# Patient Record
Sex: Female | Born: 1980 | Hispanic: Yes | Marital: Married | Smoking: Never smoker
Health system: Southern US, Community
[De-identification: ages and names within clinical notes are randomized; demographics above are authoritative.]

---

## 2007-11-29 ENCOUNTER — Inpatient Hospital Stay (HOSPITAL_COMMUNITY): Admission: AD | Admit: 2007-11-29 | Discharge: 2007-11-29 | Payer: Self-pay | Admitting: Obstetrics and Gynecology

## 2007-12-06 ENCOUNTER — Inpatient Hospital Stay (HOSPITAL_COMMUNITY): Admission: AD | Admit: 2007-12-06 | Discharge: 2007-12-06 | Payer: Self-pay | Admitting: Obstetrics and Gynecology

## 2007-12-06 ENCOUNTER — Inpatient Hospital Stay (HOSPITAL_COMMUNITY): Admission: AD | Admit: 2007-12-06 | Discharge: 2007-12-08 | Payer: Self-pay | Admitting: Obstetrics and Gynecology

## 2009-02-01 ENCOUNTER — Inpatient Hospital Stay (HOSPITAL_COMMUNITY): Admission: AD | Admit: 2009-02-01 | Discharge: 2009-02-03 | Payer: Self-pay | Admitting: Obstetrics and Gynecology

## 2010-07-06 LAB — RPR: RPR Ser Ql: NONREACTIVE

## 2010-07-06 LAB — CBC
RBC: 3.61 MIL/uL — ABNORMAL LOW (ref 3.87–5.11)
WBC: 11.2 10*3/uL — ABNORMAL HIGH (ref 4.0–10.5)

## 2010-08-16 NOTE — Discharge Summary (Signed)
NAMESHANEICE, BARSANTI              ACCOUNT NO.:  [id number]   MEDICAL RECORD NO.:  [id number]          PATIENT TYPE:  INP   LOCATION:  9126                          FACILITY:  [facility]   PHYSICIAN:  Malachi Pro. Ambrose Mantle, M.D. DATE OF BIRTH:  12/03/1980   DATE OF ADMISSION:  12/06/2007  DATE OF DISCHARGE:  12/08/2007                               DISCHARGE SUMMARY   A 30 year old Hispanic female para 0-0-1-0, gravida 2, EDC December 26, 2007, admitted in the second stage of labor.  Blood group and type O  positive, negative antibody, sickle cell negative, RPR nonreactive,  rubella immune, hepatitis B surface antigen negative, HIV negative, GC  and chlamydia negative, 1-hour Glucola 63, quad screen negative, and  group B strep negative.  Vaginal ultrasound on June 12, 2007, crown-  rump length 5.2 cm 11 weeks 6 days, [facility] December 26, 2007.  Ultrasound  on Aug 12, 2007 average gestational age [redacted] weeks 3 days, [facility] December 27, 2007.  On November 29, 2007, fundal height was 33 cm.  Ultrasound  suggested normal biophysical profile and ultrasound on December 02, 2007,  estimated fetal weight of approximately 50%.  On the day of admission  during a nonstress test in our office, the patient had a 5-minute  deceleration.  She was sent to MAU.  Initially, the fetal heart rate was  165, but subsequently, became completely normal with reactive nonstress  test and no decelerations.  Biophysical profile was 6/8.  There was no  breathing seen.  The patient was contracting pain fully, but according  to the maternity admission nurse, the cervix remained undilated, so she  was discharged.  The patient returned in approximately 1 to 2 hours  fully dilated.   PAST MEDICAL HISTORY:  Revealed no known allergies.  No operations.   ILLNESSES:  Urinary tract infections.   FAMILY HISTORY:  Mother with high blood pressure and rheumatoid  arthritis and sister with rheumatoid arthritis.   OBSTETRICAL HISTORY:  In  1999, the patient had a termination of  pregnancy.   PHYSICAL EXAMINATION:  VITAL SIGNS:  On admission, her vital signs were  normal.  HEART:  Normal.  LUNGS:  Normal.  ABDOMEN:  Soft.  Fundal height 35 cm.  Fetal heart tones normal.  Cervix  was 10 cm and vertex at a +2 station.  The patient pushed well and  delivered spontaneously OA over an intact perineum by Dr. Ambrose Mantle a  living female infant 6 pounds 2 ounces, Apgars of 9 at 1 and 9 at 5  minutes.  Placenta was intact.  Uterus was normal.  Small bilateral  labial lacerations were hemostatic and not repaired.  The cord was  approximately 14 inches long.  Blood loss about 400 mL.  Postpartum, the  patient did well and was discharged on the second postpartum day.  Initial hemoglobin was 11.7, hematocrit 35.2, white count 10,300, and  platelet count 267,000.  RPR was nonreactive.  Followup hemoglobin 11.1,  hematocrit 32.8, and white count 18,000.   FINAL DIAGNOSIS:  Intrauterine pregnancy at 37 weeks, delivered  occipitoanterior .  OPERATION:  Spontaneous delivery OA.   FINAL CONDITION:  Improved.   INSTRUCTIONS:  Include our regular discharge instruction booklet.  The  patient is given a prescription for Motrin 600 mg 30 tablets 1 every 6  hours as needed for pain and is advised to return to the office in 6  weeks for followup examination.      Malachi Pro. Ambrose Mantle, M.D.  Electronically Signed     TFH/MEDQ  D:  12/08/2007  T:  12/08/2007  Job:  [id number]

## 2011-01-04 LAB — CBC
HCT: 35.2 — ABNORMAL LOW
Hemoglobin: 11.1 — ABNORMAL LOW
Hemoglobin: 11.7 — ABNORMAL LOW
MCHC: 33.9
MCV: 89.5
Platelets: 267
RBC: 3.67 — ABNORMAL LOW
RBC: 3.9
WBC: 10.3

## 2013-03-05 LAB — OB RESULTS CONSOLE GC/CHLAMYDIA
Chlamydia: NEGATIVE
GC PROBE AMP, GENITAL: NEGATIVE

## 2013-03-05 LAB — OB RESULTS CONSOLE RPR: RPR: NONREACTIVE

## 2013-03-05 LAB — OB RESULTS CONSOLE RUBELLA ANTIBODY, IGM: Rubella: IMMUNE

## 2013-03-05 LAB — OB RESULTS CONSOLE HIV ANTIBODY (ROUTINE TESTING): HIV: NONREACTIVE

## 2013-03-05 LAB — OB RESULTS CONSOLE HEPATITIS B SURFACE ANTIGEN: Hepatitis B Surface Ag: NEGATIVE

## 2013-03-05 LAB — OB RESULTS CONSOLE ANTIBODY SCREEN: Antibody Screen: NEGATIVE

## 2013-04-30 LAB — OB RESULTS CONSOLE ABO/RH: RH TYPE: POSITIVE

## 2013-09-06 ENCOUNTER — Inpatient Hospital Stay (HOSPITAL_COMMUNITY)
Admission: AD | Admit: 2013-09-06 | Discharge: 2013-09-08 | DRG: 775 | Disposition: A | Discharge status: Home / Self Care | Payer: Medicaid Other | Source: Ambulatory Visit | Attending: Obstetrics and Gynecology | Admitting: Obstetrics and Gynecology

## 2013-09-06 ENCOUNTER — Encounter (HOSPITAL_COMMUNITY): Payer: Self-pay | Admitting: *Deleted

## 2013-09-06 DIAGNOSIS — Z833 Family history of diabetes mellitus: Secondary | ICD-10-CM

## 2013-09-06 DIAGNOSIS — IMO0001 Reserved for inherently not codable concepts without codable children: Secondary | ICD-10-CM

## 2013-09-06 LAB — CBC
HCT: 35.7 % — ABNORMAL LOW (ref 36.0–46.0)
Hemoglobin: 12 g/dL (ref 12.0–15.0)
MCH: 29 pg (ref 26.0–34.0)
MCHC: 33.6 g/dL (ref 30.0–36.0)
MCV: 86.2 fL (ref 78.0–100.0)
PLATELETS: 242 10*3/uL (ref 150–400)
RBC: 4.14 MIL/uL (ref 3.87–5.11)
RDW: 13.2 % (ref 11.5–15.5)
WBC: 10.1 10*3/uL (ref 4.0–10.5)

## 2013-09-06 LAB — TYPE AND SCREEN
ABO/RH(D): O POS
ANTIBODY SCREEN: NEGATIVE

## 2013-09-06 MED ORDER — OXYTOCIN BOLUS FROM INFUSION: 500.0000 mL | INTRAVENOUS | Status: DC

## 2013-09-06 MED ORDER — CITRIC ACID-SODIUM CITRATE 334-500 MG/5ML PO SOLN: 30.0000 mL | ORAL | Status: DC | PRN

## 2013-09-06 MED ORDER — IBUPROFEN 600 MG PO TABS: 600.0000 mg | ORAL_TABLET | Freq: Four times a day (QID) | ORAL | Status: DC | PRN

## 2013-09-06 MED ORDER — LIDOCAINE HCL (PF) 1 % IJ SOLN
30.0000 mL | INTRAMUSCULAR | Status: DC | PRN
  Filled 2013-09-06: qty 30

## 2013-09-06 MED ORDER — OXYTOCIN 40 UNITS IN LACTATED RINGERS INFUSION - SIMPLE MED
62.5000 mL/h | INTRAVENOUS | Status: DC
  Filled 2013-09-06: qty 1000

## 2013-09-06 MED ORDER — ONDANSETRON HCL 4 MG/2ML IJ SOLN: 4.0000 mg | Freq: Four times a day (QID) | INTRAMUSCULAR | Status: DC | PRN

## 2013-09-06 MED ORDER — LACTATED RINGERS IV SOLN: 500.0000 mL | INTRAVENOUS | Status: DC | PRN

## 2013-09-06 MED ORDER — ACETAMINOPHEN 325 MG PO TABS: 650.0000 mg | ORAL_TABLET | ORAL | Status: DC | PRN

## 2013-09-06 MED ORDER — OXYCODONE-ACETAMINOPHEN 5-325 MG PO TABS: 1.0000 | ORAL_TABLET | ORAL | Status: DC | PRN

## 2013-09-06 MED ORDER — FLEET ENEMA 7-19 GM/118ML RE ENEM: 1.0000 | ENEMA | RECTAL | Status: DC | PRN

## 2013-09-06 MED ORDER — LACTATED RINGERS IV SOLN
INTRAVENOUS | Status: DC
  Administered 2013-09-06: 21:00:00 via INTRAVENOUS

## 2013-09-06 NOTE — Progress Notes (Signed)
Patient ID: [name], female   DOB: 1981-01-27, 33 y.o.   MRN: [id number] Delivery note:  The pt progressed rapidly to full dilatation and pushed well to deliver a living female infant spontaneously ROA over an intact perineum. The placenta delivered intact and the uterus was normal. There were no lacerations. The pt's IV became disconnected while she was pushing and required a restart. Apgars were 9 and 9 at 1 and 5 minutes.

## 2013-09-06 NOTE — Progress Notes (Signed)
Patient ID: [name], female   DOB: 1980/11/26, 33 y.o.   MRN: [id number] Contractions are q 2-3 minutes and the cervix is 4-5 cm 100% effaced and the vertex is at - 1 station. AROM produced clear fluid.

## 2013-09-06 NOTE — MAU Note (Signed)
PT SAYS SHE HAS UC-  HURTING BAD  AT   630PM-.   VE IN OFFICE ON Friday- 3 CM.    DENIES HSV AND MRSA.  GBS- NEG

## 2013-09-06 NOTE — H&P (Signed)
NAMEWildred, [name]            ACCOUNT NO.:  [id number]  MEDICAL RECORD NO.:  [id number]  LOCATION:  9163                          FACILITY:  [facility]  PHYSICIAN:  Malachi Pro. Ambrose Mantle, M.D. DATE OF BIRTH:  03-18-1981  DATE OF ADMISSION:  09/06/2013 DATE OF DISCHARGE:                             HISTORY & PHYSICAL   PRESENT ILLNESS:  This is a 33 year old female, para 1-1-1-2, gravida 4 with last period December 05, 2013.  [facility] September 09, 2013, who was admitted in active labor.  Blood group and type O positive, negative antibody. RPR negative.  Urine culture negative.  Hepatitis B surface antigen negative, HIV negative, GC and Chlamydia negative, rubella immune, first trimester screen negative.  AFP negative.  One-hour Glucola 98.  Repeat HIV and RPR negative, group B strep negative.  The patient began her prenatal course at 13 weeks and 1 day.  Ultrasound on that day suggested a due date of September 09, 2013.  Because of her history of preterm birth, she elected to start Delalutin at 16 weeks and she took the Delalutin weekly until 36 weeks.  She began her prenatal course at 146 pounds. Initially her weight gain was not up to standards and I advised her to gain weight.  She seemed to have no problems during the pregnancy.  Her final weight at the last prenatal visit was 21 pounds greater than her initial weight.  At her last prenatal visit, she was 3 cm.  Cervix was 40% effaced, vertex at a -3 station.  She states that she began contracting tonight more forcefully, came to the hospital.  The admitting nurse and MAU thought she was 5 cm, 90%.  She was admitted.  PAST MEDICAL HISTORY:  Reveals she had chickenpox as a child, cystic fibrosis carrier screen was negative in 2009.  PAST SURGICAL HISTORY:  None.  She had a colposcopy in 2007.  ALLERGIES:  She has no known drug allergies, no latex allergy, and no food allergy.  SOCIAL HISTORY:  She never smoked.  Does not drink.  Denies  illicit drugs.  She is married, has 2 children at home, one born in 2009 and one in 2010.  FAMILY HISTORY:  Mother with high blood pressure, rheumatoid arthritis. Sister with rheumatoid arthritis and high blood pressure.  Two sisters have sickle cell trait.  Paternal grandfather has diabetes.  PHYSICAL EXAMINATION:  GENERAL:  On admission, well-developed, well- nourished female in no distress except having contractions. VITAL SIGNS:  Temperature 98.8, pulse 91, respirations 18, blood pressure 123/78. HEART:  Normal size and sounds.  No murmurs. LUNGS:  Clear to auscultation.  Fundal height at her last prenatal visit was 37 cm.  Fetal heart tones normal.  Cervix is not reexamined at this time, was thought to be 5 cm in the MAU.  ADMITTING IMPRESSION:  Intrauterine pregnancy at 39+ weeks, active labor.  The patient is admitted.  Her progress in labor will be observed.     Malachi Pro. Ambrose Mantle, M.D.     TFH/MEDQ  D:  09/06/2013  T:  09/06/2013  Job:  [id number]

## 2013-09-07 ENCOUNTER — Encounter (HOSPITAL_COMMUNITY): Payer: Self-pay | Admitting: *Deleted

## 2013-09-07 LAB — CBC
HCT: 33.8 % — ABNORMAL LOW (ref 36.0–46.0)
HEMOGLOBIN: 11.6 g/dL — AB (ref 12.0–15.0)
MCH: 29.4 pg (ref 26.0–34.0)
MCHC: 34.3 g/dL (ref 30.0–36.0)
MCV: 85.6 fL (ref 78.0–100.0)
Platelets: 220 10*3/uL (ref 150–400)
RBC: 3.95 MIL/uL (ref 3.87–5.11)
RDW: 13.2 % (ref 11.5–15.5)
WBC: 14.7 10*3/uL — ABNORMAL HIGH (ref 4.0–10.5)

## 2013-09-07 LAB — RPR

## 2013-09-07 LAB — ABO/RH: ABO/RH(D): O POS

## 2013-09-07 MED ORDER — LANOLIN HYDROUS EX OINT: TOPICAL_OINTMENT | CUTANEOUS | Status: DC | PRN

## 2013-09-07 MED ORDER — PRENATAL MULTIVITAMIN CH
1.0000 | ORAL_TABLET | Freq: Every day | ORAL | Status: DC
  Filled 2013-09-07: qty 1
  Filled 2013-09-07: qty 2

## 2013-09-07 MED ORDER — SIMETHICONE 80 MG PO CHEW: 80.0000 mg | CHEWABLE_TABLET | ORAL | Status: DC | PRN

## 2013-09-07 MED ORDER — SENNOSIDES-DOCUSATE SODIUM 8.6-50 MG PO TABS
2.0000 | ORAL_TABLET | ORAL | Status: DC
  Administered 2013-09-08: 2 via ORAL
  Filled 2013-09-07: qty 2

## 2013-09-07 MED ORDER — ONDANSETRON HCL 4 MG PO TABS: 4.0000 mg | ORAL_TABLET | ORAL | Status: DC | PRN

## 2013-09-07 MED ORDER — OXYTOCIN 40 UNITS IN LACTATED RINGERS INFUSION - SIMPLE MED: 62.5000 mL/h | INTRAVENOUS | Status: AC | PRN

## 2013-09-07 MED ORDER — IBUPROFEN 600 MG PO TABS
600.0000 mg | ORAL_TABLET | Freq: Four times a day (QID) | ORAL | Status: DC
  Administered 2013-09-07 – 2013-09-08 (×7): 600 mg via ORAL
  Filled 2013-09-07 (×7): qty 1

## 2013-09-07 MED ORDER — ZOLPIDEM TARTRATE 5 MG PO TABS: 5.0000 mg | ORAL_TABLET | Freq: Every evening | ORAL | Status: DC | PRN

## 2013-09-07 MED ORDER — DIPHENHYDRAMINE HCL 25 MG PO CAPS: 25.0000 mg | ORAL_CAPSULE | Freq: Four times a day (QID) | ORAL | Status: DC | PRN

## 2013-09-07 MED ORDER — WITCH HAZEL-GLYCERIN EX PADS: 1.0000 "application " | MEDICATED_PAD | CUTANEOUS | Status: DC | PRN

## 2013-09-07 MED ORDER — CALCIUM CARBONATE ANTACID 500 MG PO CHEW
1.0000 | CHEWABLE_TABLET | Freq: Two times a day (BID) | ORAL | Status: DC
  Filled 2013-09-07: qty 1

## 2013-09-07 MED ORDER — BENZOCAINE-MENTHOL 20-0.5 % EX AERO
1.0000 "application " | INHALATION_SPRAY | CUTANEOUS | Status: DC | PRN
  Administered 2013-09-08: 1 via TOPICAL
  Filled 2013-09-07: qty 56

## 2013-09-07 MED ORDER — MEASLES, MUMPS & RUBELLA VAC ~~LOC~~ INJ
0.5000 mL | INJECTION | Freq: Once | SUBCUTANEOUS | Status: DC
  Filled 2013-09-07: qty 0.5

## 2013-09-07 MED ORDER — LACTATED RINGERS IV SOLN
INTRAVENOUS | Status: AC
  Administered 2013-09-07: 02:00:00 via INTRAVENOUS

## 2013-09-07 MED ORDER — TETANUS-DIPHTH-ACELL PERTUSSIS 5-2.5-18.5 LF-MCG/0.5 IM SUSP: 0.5000 mL | Freq: Once | INTRAMUSCULAR | Status: DC

## 2013-09-07 MED ORDER — ONDANSETRON HCL 4 MG/2ML IJ SOLN: 4.0000 mg | INTRAMUSCULAR | Status: DC | PRN

## 2013-09-07 MED ORDER — OXYCODONE-ACETAMINOPHEN 5-325 MG PO TABS
1.0000 | ORAL_TABLET | ORAL | Status: DC | PRN
  Administered 2013-09-07 – 2013-09-08 (×3): 1 via ORAL
  Filled 2013-09-07 (×4): qty 1

## 2013-09-07 MED ORDER — PRENATAL MULTIVITAMIN CH
1.0000 | ORAL_TABLET | Freq: Every day | ORAL | Status: DC
  Administered 2013-09-07 – 2013-09-08 (×2): 1 via ORAL

## 2013-09-07 MED ORDER — DIBUCAINE 1 % RE OINT: 1.0000 "application " | TOPICAL_OINTMENT | RECTAL | Status: DC | PRN

## 2013-09-07 NOTE — Lactation Note (Signed)
This note was copied from the chart of [name]. Lactation Consultation Note [facility] LC resources given and discussed.  Encouraged to feed with early cues on demand including STS.  Early newborn behavior discussed including cluster feeding and having 8-12 feeding a day.   Hand expression demonstrated with colostrum visible.  Mom to call for assist as needed.    Patient Name: Carol Wakely Gehrke OFPUL'G Date: 09/07/2013 Reason for consult: Initial assessment   Maternal Data Has patient been taught Hand Expression?: Yes Does the patient have breastfeeding experience prior to this delivery?: No  Feeding    LATCH Score/Interventions                      Lactation Tools Discussed/Used     Consult Status Consult Status: Follow-up Date: 09/08/13 Follow-up type: In-patient    Arvella Merles Reginia Battie 09/07/2013, 9:50 PM

## 2013-09-07 NOTE — Progress Notes (Signed)
Patient ID: [name], female   DOB: 1980-10-05, 33 y.o.   MRN: [id number] #1 afebrile BP normal HGB stable No complaints

## 2013-09-08 MED ORDER — IBUPROFEN 600 MG PO TABS: 600.0000 mg | ORAL_TABLET | Freq: Four times a day (QID) | ORAL | Status: AC | PRN

## 2013-09-08 MED ORDER — OXYCODONE-ACETAMINOPHEN 5-325 MG PO TABS: 1.0000 | ORAL_TABLET | Freq: Four times a day (QID) | ORAL | Status: AC | PRN

## 2013-09-08 NOTE — Discharge Summary (Signed)
NAMEKynli, [name]            ACCOUNT NO.:  [id number]  MEDICAL RECORD NO.:  [id number]  LOCATION:  9123                          [facility]ACILITY:  [facility]  PHYSICIAN:  Malachi Pro. Ambrose Mantle, M.D. DATE OF BIRTH:  12/20/1980  DATE OF ADMISSION:  09/06/2013 DATE OF DISCHARGE:  09/08/2013                              DISCHARGE SUMMARY   HISTORY OF PRESENT ILLNESS:  This is a 33 year old female para 1-1-1-2, gravida 4, EDC September 09, 2013 admitted in active labor.  All prenatal tests were negative.  She came to the hospital.  Admitting nurse told she was 5 cm, she was admitted.  She progressed rapidly to full dilatation.  After artificial rupture of the membranes, she pushed well and delivered a living female infant 8 pounds 0 ounces spontaneously ROA over an intact perineum by Dr. Ambrose Mantle.  The placenta delivered intact. Uterus was normal.  There were no lacerations.  Apgars of the infant were 9 and 9 at 1 and 5 minutes.  Postpartum, the patient did quite well and was discharged on the second postpartum day.  LABORATORY DATA:  Initial hemoglobin 12.0, hematocrit 35.7, white count 10,100.  Followup hemoglobin 11.6,  RPR was nonreactive, platelet counts were 242 and 320.  FINAL DIAGNOSIS:  Intrauterine pregnancy at 39+ weeks, delivered vertex.  OPERATION:  Spontaneous delivery vertex.  FINAL CONDITION:  Improved.  INSTRUCTIONS:  Our regular discharge instruction booklet as well as our after visit summary.  Prescriptions for Motrin 600 mg 15 tablets 1 every 6 hours as needed for pain and Percocet 5/325, 15 tablets 1 every 6 hours as needed for pain.  She is asked to return to the office in 6 weeks for followup examination.     Malachi Pro. Ambrose Mantle, M.D.     TFH/MEDQ  D:  09/08/2013  T:  09/08/2013  Job:  [id number]

## 2013-09-08 NOTE — Discharge Instructions (Signed)
booklet °

## 2013-09-08 NOTE — Progress Notes (Signed)
Patient ID: [name], female   DOB: 06-20-1980, 33 y.o.   MRN: [id number] #[id number] afebrile BP normal no problems for d/c

## 2013-09-08 NOTE — Progress Notes (Signed)
Ur chart review completed.  

## 2014-02-02 ENCOUNTER — Encounter (HOSPITAL_COMMUNITY): Payer: Self-pay | Admitting: *Deleted

## 2019-12-26 ENCOUNTER — Ambulatory Visit: Payer: Self-pay | Admitting: Internal Medicine

## 2021-01-14 ENCOUNTER — Other Ambulatory Visit: Payer: Self-pay | Admitting: Internal Medicine

## 2021-01-14 DIAGNOSIS — Z1231 Encounter for screening mammogram for malignant neoplasm of breast: Secondary | ICD-10-CM

## 2021-03-09 ENCOUNTER — Ambulatory Visit
Admission: RE | Admit: 2021-03-09 | Discharge: 2021-03-09 | Disposition: A | Discharge status: Home / Self Care | Payer: 59 | Source: Ambulatory Visit | Attending: Internal Medicine | Admitting: Internal Medicine

## 2021-03-09 DIAGNOSIS — Z1231 Encounter for screening mammogram for malignant neoplasm of breast: Secondary | ICD-10-CM

## 2021-03-11 ENCOUNTER — Other Ambulatory Visit: Payer: Self-pay | Admitting: Internal Medicine

## 2021-03-11 DIAGNOSIS — R928 Other abnormal and inconclusive findings on diagnostic imaging of breast: Secondary | ICD-10-CM

## 2021-04-21 ENCOUNTER — Other Ambulatory Visit: Payer: 59

## 2021-05-10 ENCOUNTER — Other Ambulatory Visit: Payer: Self-pay | Admitting: Obstetrics and Gynecology

## 2021-05-10 ENCOUNTER — Other Ambulatory Visit (HOSPITAL_COMMUNITY)
Admission: RE | Admit: 2021-05-10 | Discharge: 2021-05-10 | Disposition: A | Discharge status: Home / Self Care | Payer: Medicaid Other | Source: Ambulatory Visit | Attending: Obstetrics and Gynecology | Admitting: Obstetrics and Gynecology

## 2021-05-10 DIAGNOSIS — Z124 Encounter for screening for malignant neoplasm of cervix: Secondary | ICD-10-CM | POA: Diagnosis present

## 2021-05-11 ENCOUNTER — Other Ambulatory Visit: Payer: Medicaid Other

## 2021-05-16 LAB — CYTOLOGY - PAP
Comment: NEGATIVE
Comment: NEGATIVE
Diagnosis: UNDETERMINED — AB
HPV 16: NEGATIVE
HPV 18 / 45: NEGATIVE
High risk HPV: POSITIVE — AB

## 2021-05-20 ENCOUNTER — Other Ambulatory Visit: Payer: Medicaid Other

## 2021-05-25 ENCOUNTER — Other Ambulatory Visit: Payer: Medicaid Other

## 2021-06-14 ENCOUNTER — Ambulatory Visit
Admission: RE | Admit: 2021-06-14 | Discharge: 2021-06-14 | Disposition: A | Discharge status: Home / Self Care | Payer: Managed Care, Other (non HMO) | Source: Ambulatory Visit | Attending: Internal Medicine | Admitting: Internal Medicine

## 2021-06-14 ENCOUNTER — Other Ambulatory Visit: Payer: Self-pay | Admitting: Internal Medicine

## 2021-06-14 ENCOUNTER — Ambulatory Visit
Admission: RE | Admit: 2021-06-14 | Discharge: 2021-06-14 | Disposition: A | Discharge status: Home / Self Care | Payer: Medicaid Other | Source: Ambulatory Visit | Attending: Internal Medicine | Admitting: Internal Medicine

## 2021-06-14 DIAGNOSIS — R928 Other abnormal and inconclusive findings on diagnostic imaging of breast: Secondary | ICD-10-CM

## 2021-12-16 ENCOUNTER — Other Ambulatory Visit: Payer: Managed Care, Other (non HMO)

## 2022-04-11 ENCOUNTER — Other Ambulatory Visit: Payer: Managed Care, Other (non HMO)

## 2022-06-15 ENCOUNTER — Other Ambulatory Visit: Payer: Self-pay | Admitting: Internal Medicine

## 2022-06-15 DIAGNOSIS — R928 Other abnormal and inconclusive findings on diagnostic imaging of breast: Secondary | ICD-10-CM

## 2022-06-29 ENCOUNTER — Ambulatory Visit
Admission: RE | Admit: 2022-06-29 | Discharge: 2022-06-29 | Disposition: A | Discharge status: Home / Self Care | Payer: 59 | Source: Ambulatory Visit | Attending: Internal Medicine | Admitting: Internal Medicine

## 2022-06-29 ENCOUNTER — Ambulatory Visit
Admission: RE | Admit: 2022-06-29 | Discharge: 2022-06-29 | Disposition: A | Discharge status: Home / Self Care | Payer: Medicaid Other | Source: Ambulatory Visit | Attending: Internal Medicine | Admitting: Internal Medicine

## 2022-06-29 DIAGNOSIS — R928 Other abnormal and inconclusive findings on diagnostic imaging of breast: Secondary | ICD-10-CM

## 2022-06-29 DIAGNOSIS — R922 Inconclusive mammogram: Secondary | ICD-10-CM | POA: Diagnosis not present

## 2022-07-23 IMAGING — MG MM DIGITAL SCREENING BILAT W/ TOMO AND CAD
8 series · 9 of 24 positions shown · non-contrast
Comparison: Previous exam(s).

CLINICAL DATA: Screening.

EXAM:
DIGITAL SCREENING BILATERAL MAMMOGRAM WITH TOMOSYNTHESIS AND CAD
TECHNIQUE: Bilateral screening digital craniocaudal and mediolateral oblique
mammograms were obtained. Bilateral screening digital breast
tomosynthesis was performed. The images were evaluated with
computer-aided detection.

[L MLO synth-2D]
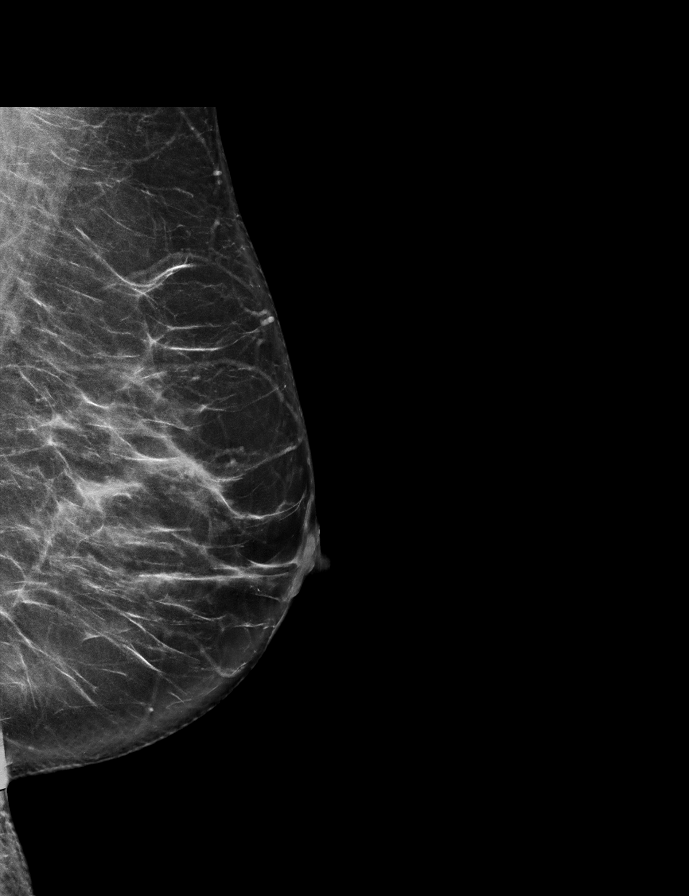

[L CC synth-2D]
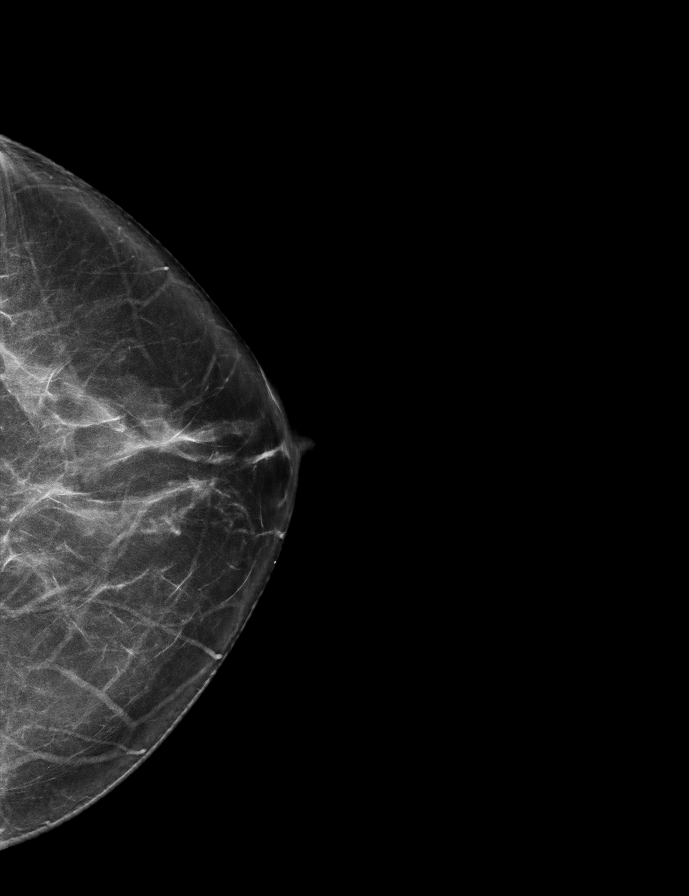

[R CC synth-2D]
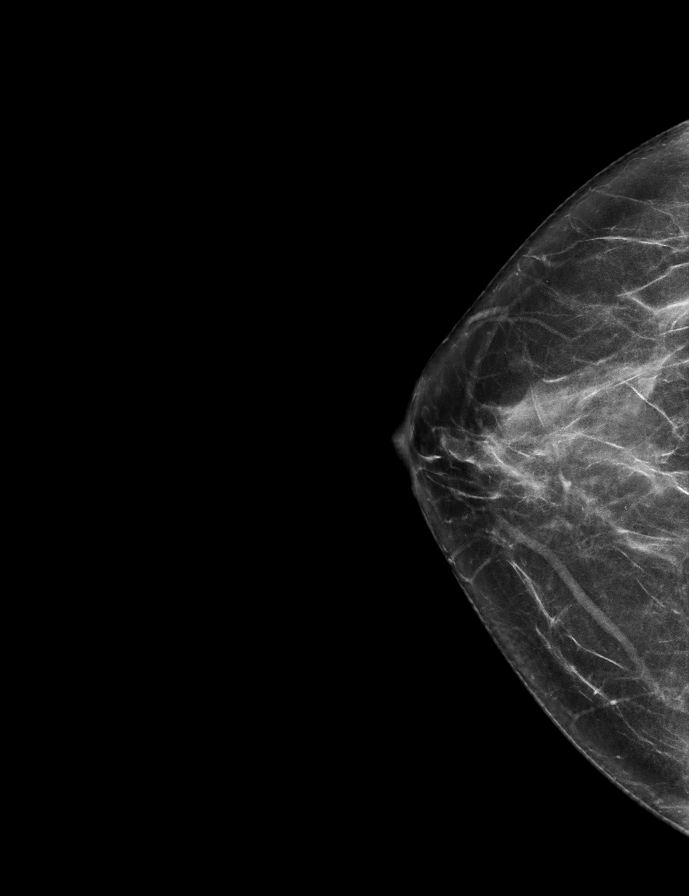

[R MLO synth-2D]
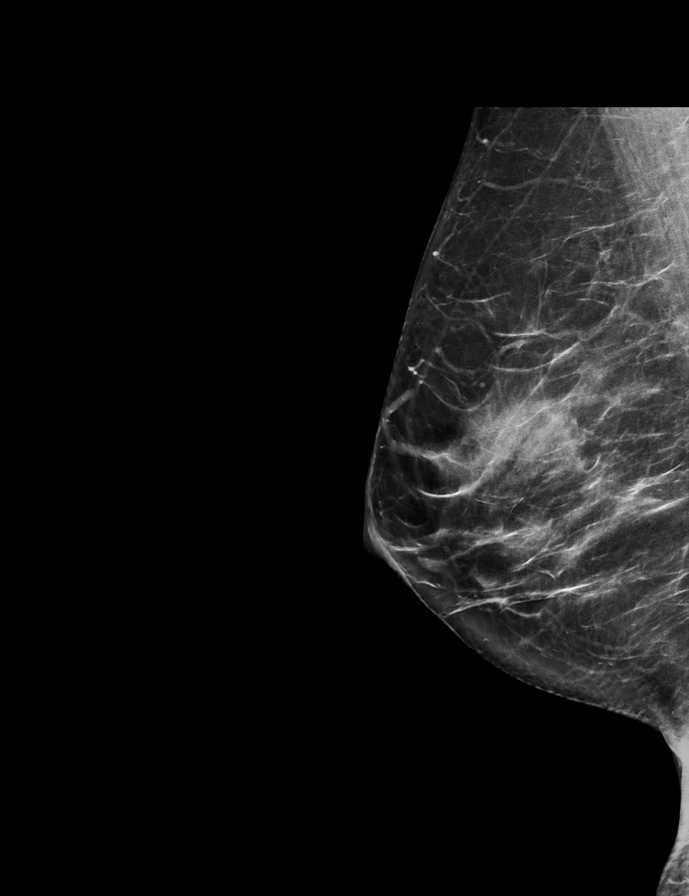

[R CC tomo · 2 of 63 frames shown]
[frame 21/63]
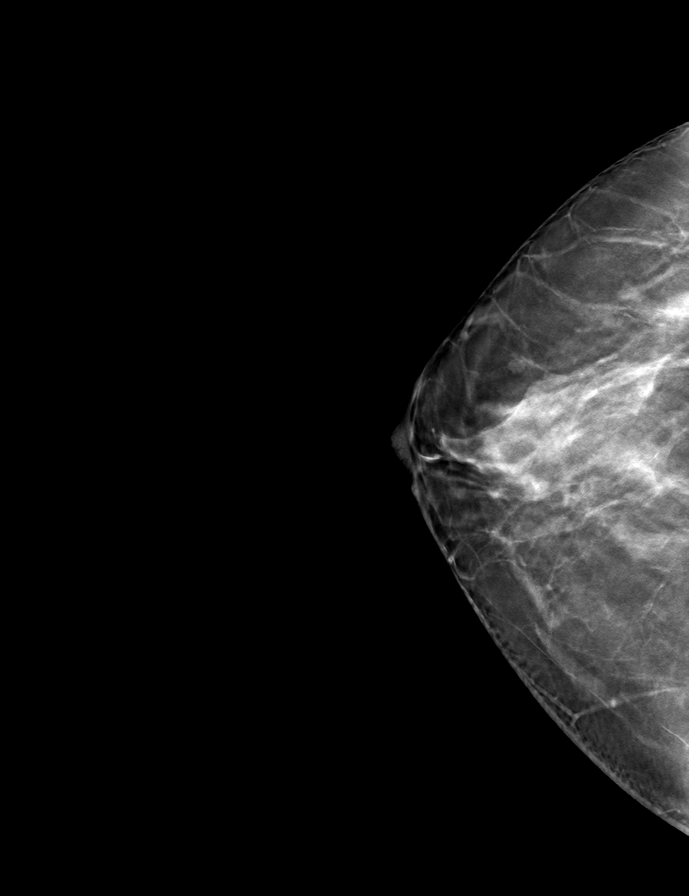
[frame 32/63]
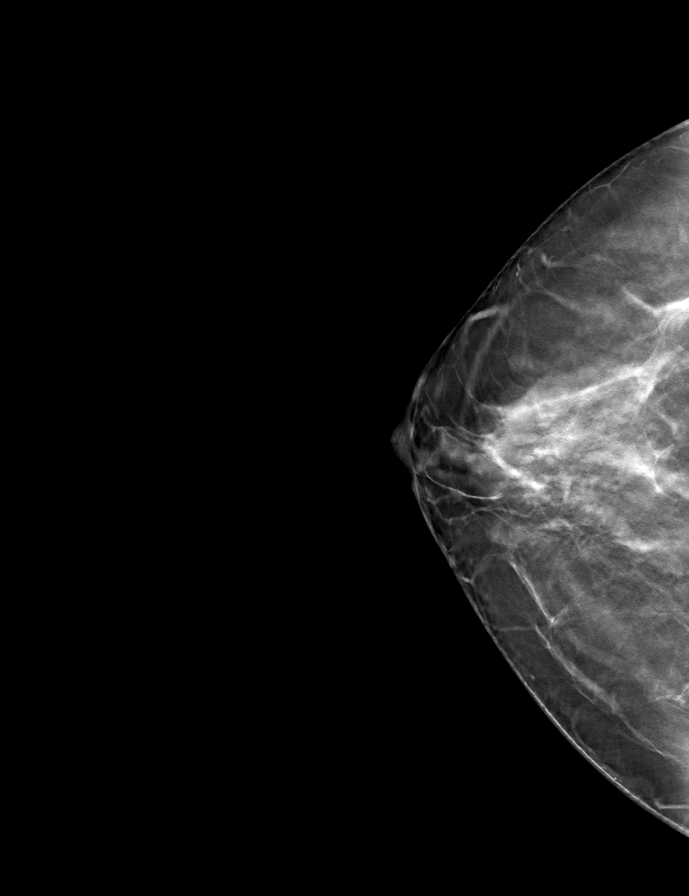

[R MLO tomo · tomo slice 35/70.0]
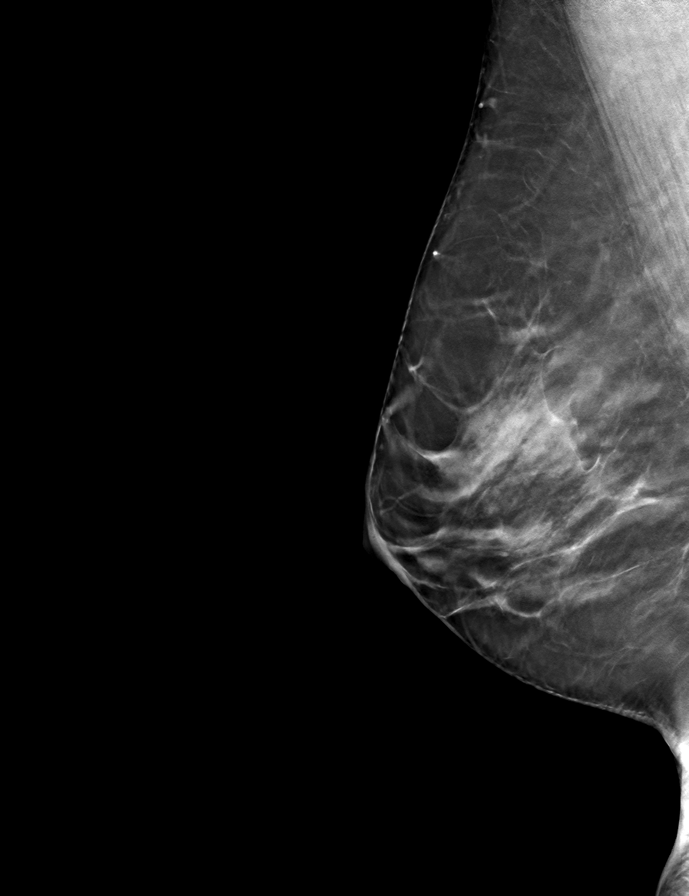

[L CC tomo · tomo slice 35/68.0]
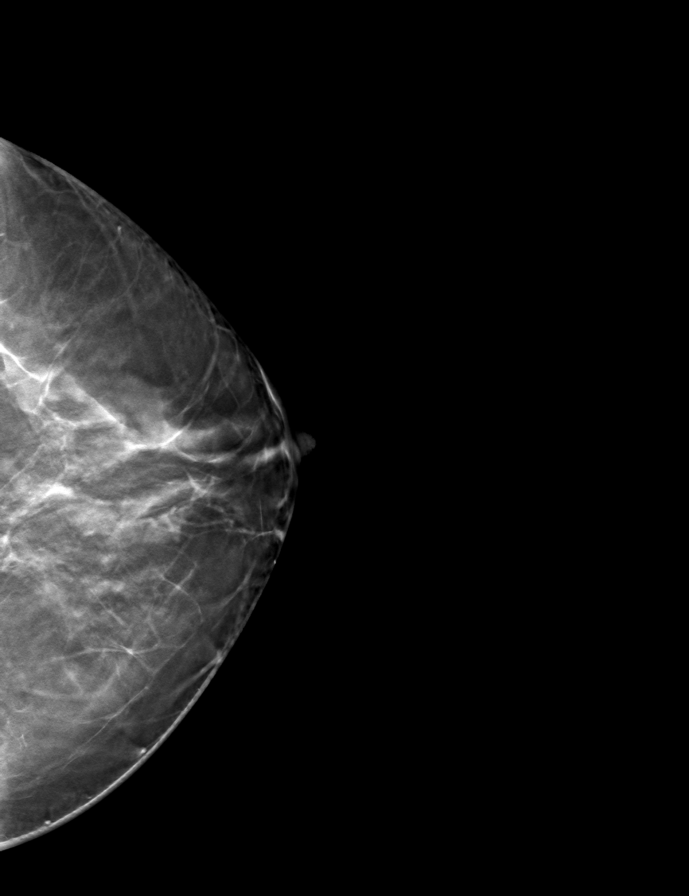

[L MLO tomo · tomo slice 37/72.0]
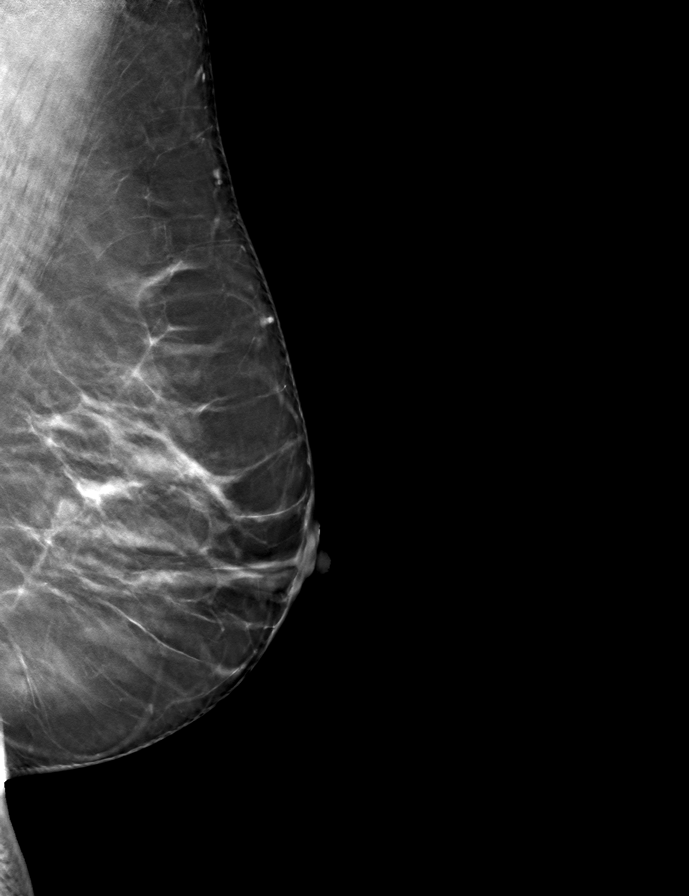

[9 of 24 positions shown; findings below may reference images not displayed]

ACR Breast Density Category c: The breast tissue is heterogeneously
dense, which may obscure small masses.
FINDINGS: In the left breast, a possible mass warrants further evaluation. In
the right breast, no findings suspicious for malignancy.
IMPRESSION: Further evaluation is suggested for a possible mass in the left
breast.

RECOMMENDATION:
Diagnostic mammogram and possibly ultrasound of the left breast.
(Code:C3-P-XXB)

The patient will be contacted regarding the findings, and additional
imaging will be scheduled.

BI-RADS CATEGORY  0: Incomplete. Need additional imaging evaluation
and/or prior mammograms for comparison.

## 2022-10-28 IMAGING — US US BREAST*L* LIMITED INC AXILLA
1 series · 10 of 10 positions shown · non-contrast
Comparison: Previous exam(s).

CLINICAL DATA: Screening recall from baseline for a possible left
breast mass.

EXAM:
DIGITAL DIAGNOSTIC UNILATERAL LEFT MAMMOGRAM WITH TOMOSYNTHESIS AND
CAD; ULTRASOUND LEFT BREAST LIMITED
TECHNIQUE: Left digital diagnostic mammography and breast tomosynthesis was
performed. The images were evaluated with computer-aided detection.;
Targeted ultrasound examination of the left breast was performed.

[Series 1: us breast*left* limited inc axilla · 0.06mm/px · 10 of 10 slices shown]
[im 1/10]
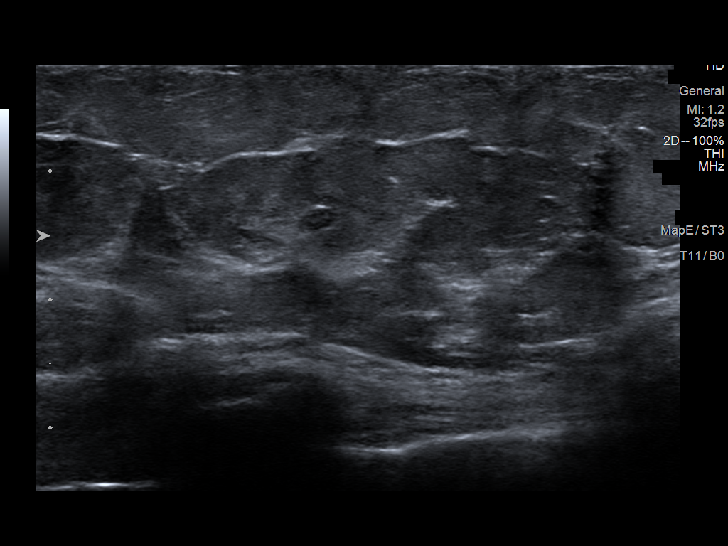
[im 2/10]
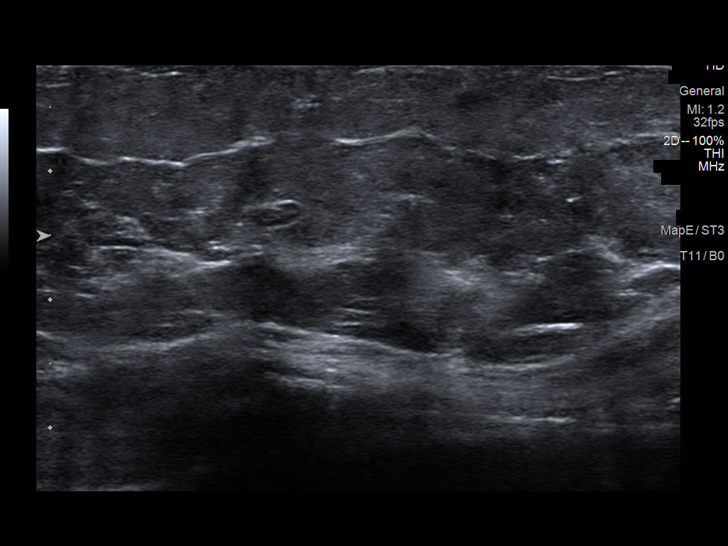
[im 3/10]
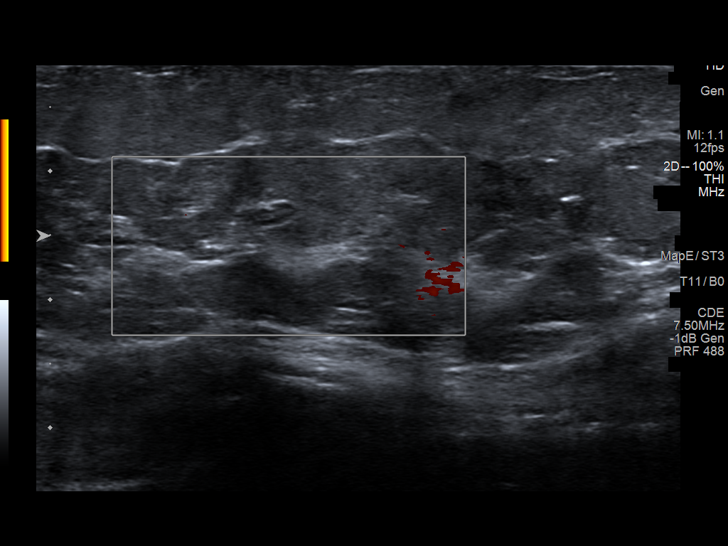
[im 4/10]
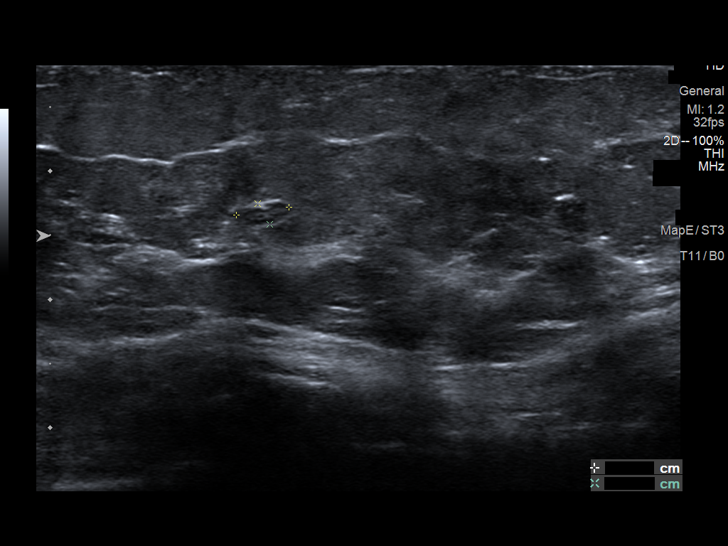
[im 5/10]
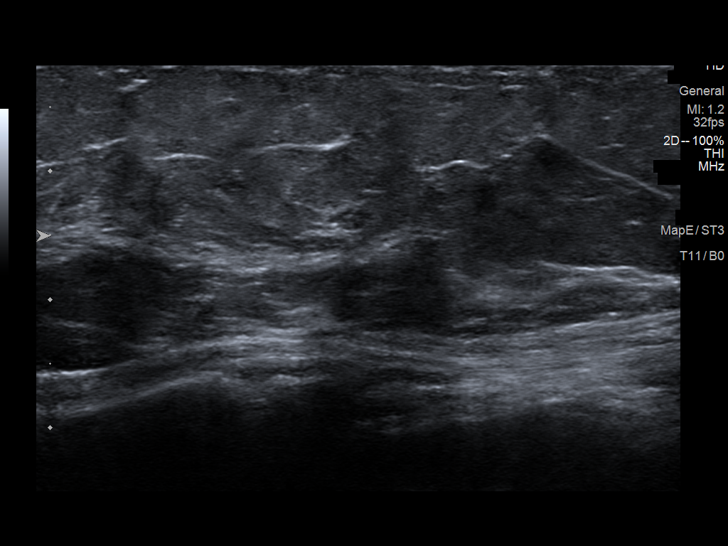
[im 6/10]
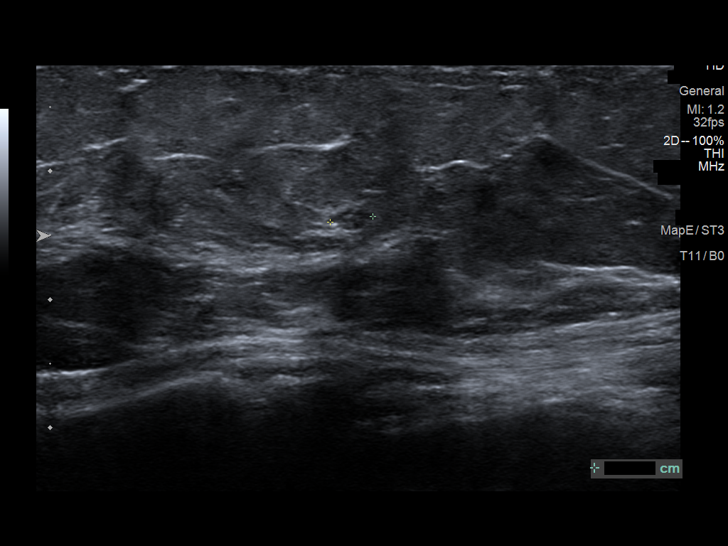
[im 7/10]
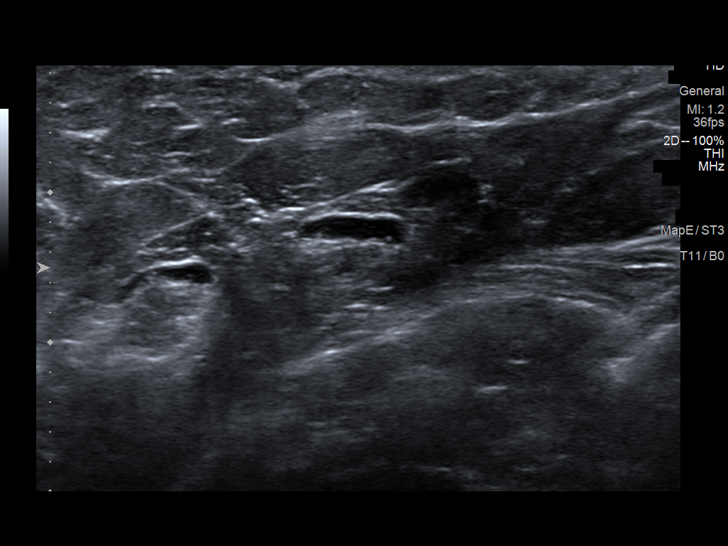
[im 8/10]
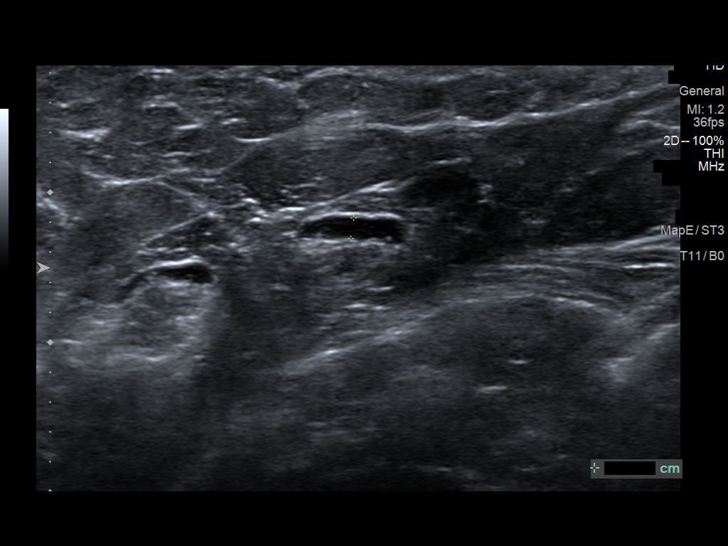
[im 9/10]
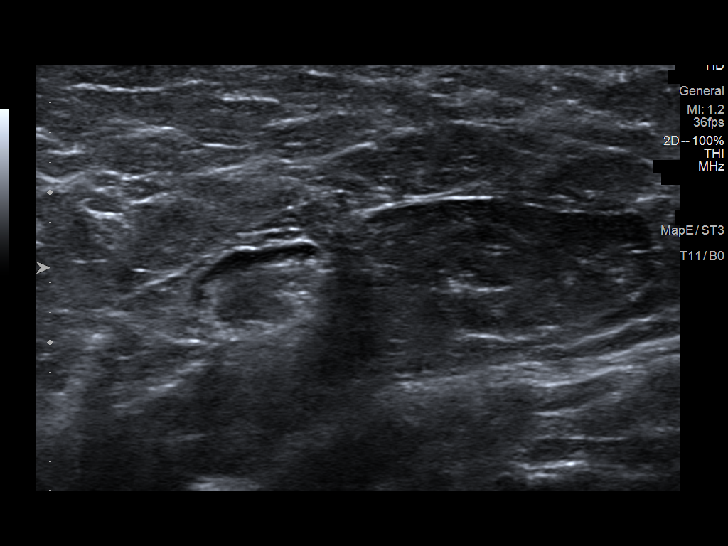
[im 10/10]
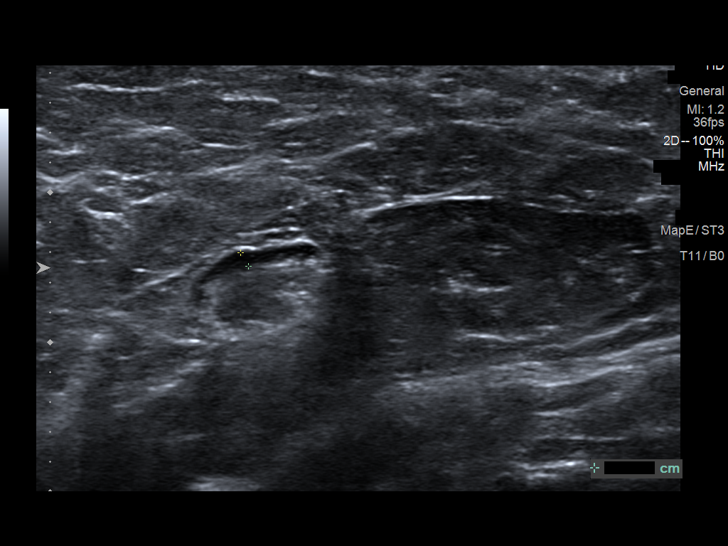

[10 of 10 positions shown; findings below may reference images not displayed]

ACR Breast Density Category b: There are scattered areas of
fibroglandular density.
FINDINGS: Spot compression tomosynthesis images through the medial left breast
demonstrates a focal asymmetry/possible mass measuring approximately
5 mm.

Ultrasound of the left breast at [DATE], 5 cm from the nipple
demonstrates a hypoechoic oval circumscribed mass measuring 4 x 2 x
3 mm.
IMPRESSION: There is a likely benign mass in the left breast at [DATE].

RECOMMENDATION:
Six-month follow-up diagnostic left breast mammogram and ultrasound.

I have discussed the findings and recommendations with the patient.
If applicable, a reminder letter will be sent to the patient
regarding the next appointment.

BI-RADS CATEGORY  3: Probably benign.
